# Patient Record
Sex: Male | Born: 1950 | Race: White | Hispanic: No | Marital: Married
Health system: Southern US, Community
[De-identification: ages and names within clinical notes are randomized; demographics above are authoritative.]

---

## 2008-07-02 ENCOUNTER — Ambulatory Visit: Payer: Self-pay | Admitting: Cardiothoracic Surgery

## 2008-08-20 ENCOUNTER — Ambulatory Visit: Payer: Self-pay | Admitting: Thoracic Surgery (Cardiothoracic Vascular Surgery)

## 2011-12-29 DIAGNOSIS — I10 Essential (primary) hypertension: Secondary | ICD-10-CM | POA: Diagnosis not present

## 2011-12-29 DIAGNOSIS — E7800 Pure hypercholesterolemia, unspecified: Secondary | ICD-10-CM | POA: Diagnosis not present

## 2011-12-29 DIAGNOSIS — E119 Type 2 diabetes mellitus without complications: Secondary | ICD-10-CM | POA: Diagnosis not present

## 2011-12-29 DIAGNOSIS — F29 Unspecified psychosis not due to a substance or known physiological condition: Secondary | ICD-10-CM | POA: Diagnosis not present

## 2012-03-09 DIAGNOSIS — L821 Other seborrheic keratosis: Secondary | ICD-10-CM | POA: Diagnosis not present

## 2012-03-09 DIAGNOSIS — L820 Inflamed seborrheic keratosis: Secondary | ICD-10-CM | POA: Diagnosis not present

## 2012-03-26 DIAGNOSIS — E7800 Pure hypercholesterolemia, unspecified: Secondary | ICD-10-CM | POA: Diagnosis not present

## 2012-03-26 DIAGNOSIS — I10 Essential (primary) hypertension: Secondary | ICD-10-CM | POA: Diagnosis not present

## 2012-03-26 DIAGNOSIS — I2510 Atherosclerotic heart disease of native coronary artery without angina pectoris: Secondary | ICD-10-CM | POA: Diagnosis not present

## 2012-03-26 DIAGNOSIS — E119 Type 2 diabetes mellitus without complications: Secondary | ICD-10-CM | POA: Diagnosis not present

## 2012-03-26 DIAGNOSIS — Z79899 Other long term (current) drug therapy: Secondary | ICD-10-CM | POA: Diagnosis not present

## 2012-03-26 DIAGNOSIS — F528 Other sexual dysfunction not due to a substance or known physiological condition: Secondary | ICD-10-CM | POA: Diagnosis not present

## 2012-04-10 DIAGNOSIS — R0609 Other forms of dyspnea: Secondary | ICD-10-CM | POA: Diagnosis not present

## 2012-04-10 DIAGNOSIS — R7309 Other abnormal glucose: Secondary | ICD-10-CM | POA: Diagnosis not present

## 2012-04-10 DIAGNOSIS — R0989 Other specified symptoms and signs involving the circulatory and respiratory systems: Secondary | ICD-10-CM | POA: Diagnosis not present

## 2012-04-10 DIAGNOSIS — E785 Hyperlipidemia, unspecified: Secondary | ICD-10-CM | POA: Diagnosis not present

## 2012-04-10 DIAGNOSIS — I2510 Atherosclerotic heart disease of native coronary artery without angina pectoris: Secondary | ICD-10-CM | POA: Diagnosis not present

## 2012-06-25 DIAGNOSIS — E119 Type 2 diabetes mellitus without complications: Secondary | ICD-10-CM | POA: Diagnosis not present

## 2012-06-25 DIAGNOSIS — I10 Essential (primary) hypertension: Secondary | ICD-10-CM | POA: Diagnosis not present

## 2012-06-25 DIAGNOSIS — E7800 Pure hypercholesterolemia, unspecified: Secondary | ICD-10-CM | POA: Diagnosis not present

## 2012-07-31 DIAGNOSIS — M5137 Other intervertebral disc degeneration, lumbosacral region: Secondary | ICD-10-CM | POA: Diagnosis not present

## 2012-07-31 DIAGNOSIS — M999 Biomechanical lesion, unspecified: Secondary | ICD-10-CM | POA: Diagnosis not present

## 2012-08-02 DIAGNOSIS — M5137 Other intervertebral disc degeneration, lumbosacral region: Secondary | ICD-10-CM | POA: Diagnosis not present

## 2012-08-02 DIAGNOSIS — M999 Biomechanical lesion, unspecified: Secondary | ICD-10-CM | POA: Diagnosis not present

## 2012-08-09 DIAGNOSIS — M999 Biomechanical lesion, unspecified: Secondary | ICD-10-CM | POA: Diagnosis not present

## 2012-08-09 DIAGNOSIS — M5137 Other intervertebral disc degeneration, lumbosacral region: Secondary | ICD-10-CM | POA: Diagnosis not present

## 2012-10-25 DIAGNOSIS — E7800 Pure hypercholesterolemia, unspecified: Secondary | ICD-10-CM | POA: Diagnosis not present

## 2012-10-25 DIAGNOSIS — E119 Type 2 diabetes mellitus without complications: Secondary | ICD-10-CM | POA: Diagnosis not present

## 2012-10-25 DIAGNOSIS — I10 Essential (primary) hypertension: Secondary | ICD-10-CM | POA: Diagnosis not present

## 2012-10-25 DIAGNOSIS — K429 Umbilical hernia without obstruction or gangrene: Secondary | ICD-10-CM | POA: Diagnosis not present

## 2012-10-25 DIAGNOSIS — Z23 Encounter for immunization: Secondary | ICD-10-CM | POA: Diagnosis not present

## 2012-11-05 DIAGNOSIS — E119 Type 2 diabetes mellitus without complications: Secondary | ICD-10-CM | POA: Diagnosis not present

## 2012-11-05 DIAGNOSIS — I2510 Atherosclerotic heart disease of native coronary artery without angina pectoris: Secondary | ICD-10-CM | POA: Diagnosis not present

## 2012-11-05 DIAGNOSIS — E785 Hyperlipidemia, unspecified: Secondary | ICD-10-CM | POA: Diagnosis not present

## 2013-01-24 DIAGNOSIS — M25559 Pain in unspecified hip: Secondary | ICD-10-CM | POA: Diagnosis not present

## 2013-02-15 DIAGNOSIS — M25559 Pain in unspecified hip: Secondary | ICD-10-CM | POA: Diagnosis not present

## 2013-02-15 DIAGNOSIS — M169 Osteoarthritis of hip, unspecified: Secondary | ICD-10-CM | POA: Diagnosis not present

## 2013-02-22 DIAGNOSIS — M25559 Pain in unspecified hip: Secondary | ICD-10-CM | POA: Diagnosis not present

## 2013-02-28 DIAGNOSIS — I10 Essential (primary) hypertension: Secondary | ICD-10-CM | POA: Diagnosis not present

## 2013-02-28 DIAGNOSIS — Z7982 Long term (current) use of aspirin: Secondary | ICD-10-CM | POA: Diagnosis not present

## 2013-02-28 DIAGNOSIS — Z79899 Other long term (current) drug therapy: Secondary | ICD-10-CM | POA: Diagnosis not present

## 2013-02-28 DIAGNOSIS — Z951 Presence of aortocoronary bypass graft: Secondary | ICD-10-CM | POA: Diagnosis not present

## 2013-02-28 DIAGNOSIS — I252 Old myocardial infarction: Secondary | ICD-10-CM | POA: Diagnosis not present

## 2013-02-28 DIAGNOSIS — R6889 Other general symptoms and signs: Secondary | ICD-10-CM | POA: Diagnosis not present

## 2013-02-28 DIAGNOSIS — Z9861 Coronary angioplasty status: Secondary | ICD-10-CM | POA: Diagnosis not present

## 2013-02-28 DIAGNOSIS — R05 Cough: Secondary | ICD-10-CM | POA: Diagnosis not present

## 2013-02-28 DIAGNOSIS — R0989 Other specified symptoms and signs involving the circulatory and respiratory systems: Secondary | ICD-10-CM | POA: Diagnosis not present

## 2013-02-28 DIAGNOSIS — J069 Acute upper respiratory infection, unspecified: Secondary | ICD-10-CM | POA: Diagnosis not present

## 2013-02-28 DIAGNOSIS — J3489 Other specified disorders of nose and nasal sinuses: Secondary | ICD-10-CM | POA: Diagnosis not present

## 2013-02-28 DIAGNOSIS — E785 Hyperlipidemia, unspecified: Secondary | ICD-10-CM | POA: Diagnosis not present

## 2013-03-12 DIAGNOSIS — E785 Hyperlipidemia, unspecified: Secondary | ICD-10-CM | POA: Diagnosis not present

## 2013-03-12 DIAGNOSIS — Z79899 Other long term (current) drug therapy: Secondary | ICD-10-CM | POA: Diagnosis not present

## 2013-03-12 DIAGNOSIS — Z951 Presence of aortocoronary bypass graft: Secondary | ICD-10-CM | POA: Diagnosis not present

## 2013-03-12 DIAGNOSIS — Z7982 Long term (current) use of aspirin: Secondary | ICD-10-CM | POA: Diagnosis not present

## 2013-03-12 DIAGNOSIS — I252 Old myocardial infarction: Secondary | ICD-10-CM | POA: Diagnosis not present

## 2013-03-12 DIAGNOSIS — I10 Essential (primary) hypertension: Secondary | ICD-10-CM | POA: Diagnosis not present

## 2013-03-12 DIAGNOSIS — Z9861 Coronary angioplasty status: Secondary | ICD-10-CM | POA: Diagnosis not present

## 2013-03-12 DIAGNOSIS — R05 Cough: Secondary | ICD-10-CM | POA: Diagnosis not present

## 2013-03-12 DIAGNOSIS — IMO0002 Reserved for concepts with insufficient information to code with codable children: Secondary | ICD-10-CM | POA: Diagnosis not present

## 2013-03-12 DIAGNOSIS — J209 Acute bronchitis, unspecified: Secondary | ICD-10-CM | POA: Diagnosis not present

## 2013-03-12 DIAGNOSIS — R079 Chest pain, unspecified: Secondary | ICD-10-CM | POA: Diagnosis not present

## 2013-04-23 DIAGNOSIS — Z79899 Other long term (current) drug therapy: Secondary | ICD-10-CM | POA: Diagnosis not present

## 2013-04-23 DIAGNOSIS — E7800 Pure hypercholesterolemia, unspecified: Secondary | ICD-10-CM | POA: Diagnosis not present

## 2013-04-23 DIAGNOSIS — E119 Type 2 diabetes mellitus without complications: Secondary | ICD-10-CM | POA: Diagnosis not present

## 2013-04-23 DIAGNOSIS — I10 Essential (primary) hypertension: Secondary | ICD-10-CM | POA: Diagnosis not present

## 2013-05-20 DIAGNOSIS — I2510 Atherosclerotic heart disease of native coronary artery without angina pectoris: Secondary | ICD-10-CM | POA: Diagnosis not present

## 2013-05-20 DIAGNOSIS — E785 Hyperlipidemia, unspecified: Secondary | ICD-10-CM | POA: Diagnosis not present

## 2013-05-20 DIAGNOSIS — E119 Type 2 diabetes mellitus without complications: Secondary | ICD-10-CM | POA: Diagnosis not present

## 2013-05-20 DIAGNOSIS — R0609 Other forms of dyspnea: Secondary | ICD-10-CM | POA: Diagnosis not present

## 2013-05-20 DIAGNOSIS — R079 Chest pain, unspecified: Secondary | ICD-10-CM | POA: Diagnosis not present

## 2013-05-20 DIAGNOSIS — R0989 Other specified symptoms and signs involving the circulatory and respiratory systems: Secondary | ICD-10-CM | POA: Diagnosis not present

## 2013-07-25 DIAGNOSIS — E119 Type 2 diabetes mellitus without complications: Secondary | ICD-10-CM | POA: Diagnosis not present

## 2013-07-25 DIAGNOSIS — I10 Essential (primary) hypertension: Secondary | ICD-10-CM | POA: Diagnosis not present

## 2013-07-25 DIAGNOSIS — M79609 Pain in unspecified limb: Secondary | ICD-10-CM | POA: Diagnosis not present

## 2013-07-25 DIAGNOSIS — F528 Other sexual dysfunction not due to a substance or known physiological condition: Secondary | ICD-10-CM | POA: Diagnosis not present

## 2013-07-25 DIAGNOSIS — E7800 Pure hypercholesterolemia, unspecified: Secondary | ICD-10-CM | POA: Diagnosis not present

## 2013-07-25 DIAGNOSIS — L723 Sebaceous cyst: Secondary | ICD-10-CM | POA: Diagnosis not present

## 2013-08-15 DIAGNOSIS — D485 Neoplasm of uncertain behavior of skin: Secondary | ICD-10-CM | POA: Diagnosis not present

## 2013-10-14 DIAGNOSIS — J069 Acute upper respiratory infection, unspecified: Secondary | ICD-10-CM | POA: Diagnosis not present

## 2013-11-12 DIAGNOSIS — I10 Essential (primary) hypertension: Secondary | ICD-10-CM | POA: Diagnosis not present

## 2013-11-12 DIAGNOSIS — E7800 Pure hypercholesterolemia, unspecified: Secondary | ICD-10-CM | POA: Diagnosis not present

## 2013-11-12 DIAGNOSIS — Z1211 Encounter for screening for malignant neoplasm of colon: Secondary | ICD-10-CM | POA: Diagnosis not present

## 2013-11-12 DIAGNOSIS — E119 Type 2 diabetes mellitus without complications: Secondary | ICD-10-CM | POA: Diagnosis not present

## 2013-11-12 DIAGNOSIS — Z23 Encounter for immunization: Secondary | ICD-10-CM | POA: Diagnosis not present

## 2014-03-13 DIAGNOSIS — I2510 Atherosclerotic heart disease of native coronary artery without angina pectoris: Secondary | ICD-10-CM | POA: Diagnosis not present

## 2014-03-13 DIAGNOSIS — R0989 Other specified symptoms and signs involving the circulatory and respiratory systems: Secondary | ICD-10-CM | POA: Diagnosis not present

## 2014-03-13 DIAGNOSIS — R0609 Other forms of dyspnea: Secondary | ICD-10-CM | POA: Diagnosis not present

## 2014-03-13 DIAGNOSIS — E785 Hyperlipidemia, unspecified: Secondary | ICD-10-CM | POA: Diagnosis not present

## 2014-03-13 DIAGNOSIS — R079 Chest pain, unspecified: Secondary | ICD-10-CM | POA: Diagnosis not present

## 2014-03-13 DIAGNOSIS — E119 Type 2 diabetes mellitus without complications: Secondary | ICD-10-CM | POA: Diagnosis not present

## 2014-04-11 DIAGNOSIS — M25559 Pain in unspecified hip: Secondary | ICD-10-CM | POA: Diagnosis not present

## 2014-04-11 DIAGNOSIS — M169 Osteoarthritis of hip, unspecified: Secondary | ICD-10-CM | POA: Diagnosis not present

## 2014-04-11 DIAGNOSIS — M1610 Unilateral primary osteoarthritis, unspecified hip: Secondary | ICD-10-CM | POA: Diagnosis not present

## 2014-04-21 DIAGNOSIS — Z79899 Other long term (current) drug therapy: Secondary | ICD-10-CM | POA: Diagnosis not present

## 2014-04-21 DIAGNOSIS — M79609 Pain in unspecified limb: Secondary | ICD-10-CM | POA: Diagnosis not present

## 2014-04-24 DIAGNOSIS — M79609 Pain in unspecified limb: Secondary | ICD-10-CM | POA: Diagnosis not present

## 2014-04-25 DIAGNOSIS — M1610 Unilateral primary osteoarthritis, unspecified hip: Secondary | ICD-10-CM | POA: Diagnosis not present

## 2014-04-25 DIAGNOSIS — M25559 Pain in unspecified hip: Secondary | ICD-10-CM | POA: Diagnosis not present

## 2014-04-25 DIAGNOSIS — M169 Osteoarthritis of hip, unspecified: Secondary | ICD-10-CM | POA: Diagnosis not present

## 2014-04-25 DIAGNOSIS — R29898 Other symptoms and signs involving the musculoskeletal system: Secondary | ICD-10-CM | POA: Diagnosis not present

## 2014-05-01 DIAGNOSIS — E7800 Pure hypercholesterolemia, unspecified: Secondary | ICD-10-CM | POA: Diagnosis not present

## 2014-05-01 DIAGNOSIS — I10 Essential (primary) hypertension: Secondary | ICD-10-CM | POA: Diagnosis not present

## 2014-05-01 DIAGNOSIS — E119 Type 2 diabetes mellitus without complications: Secondary | ICD-10-CM | POA: Diagnosis not present

## 2014-05-01 DIAGNOSIS — Z79899 Other long term (current) drug therapy: Secondary | ICD-10-CM | POA: Diagnosis not present

## 2014-06-06 DIAGNOSIS — R1032 Left lower quadrant pain: Secondary | ICD-10-CM | POA: Diagnosis not present

## 2014-07-03 DIAGNOSIS — M5137 Other intervertebral disc degeneration, lumbosacral region: Secondary | ICD-10-CM | POA: Diagnosis not present

## 2014-07-03 DIAGNOSIS — R1032 Left lower quadrant pain: Secondary | ICD-10-CM | POA: Diagnosis not present

## 2014-07-04 DIAGNOSIS — E119 Type 2 diabetes mellitus without complications: Secondary | ICD-10-CM | POA: Diagnosis not present

## 2014-07-04 DIAGNOSIS — R1084 Generalized abdominal pain: Secondary | ICD-10-CM | POA: Diagnosis not present

## 2014-07-04 DIAGNOSIS — K8020 Calculus of gallbladder without cholecystitis without obstruction: Secondary | ICD-10-CM | POA: Diagnosis not present

## 2014-07-04 DIAGNOSIS — R160 Hepatomegaly, not elsewhere classified: Secondary | ICD-10-CM | POA: Diagnosis not present

## 2014-07-04 DIAGNOSIS — K7689 Other specified diseases of liver: Secondary | ICD-10-CM | POA: Diagnosis not present

## 2014-07-08 DIAGNOSIS — K8010 Calculus of gallbladder with chronic cholecystitis without obstruction: Secondary | ICD-10-CM | POA: Diagnosis not present

## 2014-07-11 DIAGNOSIS — K8010 Calculus of gallbladder with chronic cholecystitis without obstruction: Secondary | ICD-10-CM | POA: Diagnosis not present

## 2014-07-11 DIAGNOSIS — I4510 Unspecified right bundle-branch block: Secondary | ICD-10-CM | POA: Diagnosis not present

## 2014-07-14 DIAGNOSIS — K8010 Calculus of gallbladder with chronic cholecystitis without obstruction: Secondary | ICD-10-CM | POA: Diagnosis not present

## 2014-07-14 DIAGNOSIS — M129 Arthropathy, unspecified: Secondary | ICD-10-CM | POA: Diagnosis not present

## 2014-07-14 DIAGNOSIS — K828 Other specified diseases of gallbladder: Secondary | ICD-10-CM | POA: Diagnosis not present

## 2014-07-14 DIAGNOSIS — D135 Benign neoplasm of extrahepatic bile ducts: Secondary | ICD-10-CM | POA: Diagnosis not present

## 2014-07-14 DIAGNOSIS — K811 Chronic cholecystitis: Secondary | ICD-10-CM | POA: Diagnosis not present

## 2014-07-14 DIAGNOSIS — Z79899 Other long term (current) drug therapy: Secondary | ICD-10-CM | POA: Diagnosis not present

## 2014-07-14 DIAGNOSIS — I2510 Atherosclerotic heart disease of native coronary artery without angina pectoris: Secondary | ICD-10-CM | POA: Diagnosis not present

## 2014-07-14 DIAGNOSIS — D134 Benign neoplasm of liver: Secondary | ICD-10-CM | POA: Diagnosis not present

## 2014-07-14 DIAGNOSIS — Z951 Presence of aortocoronary bypass graft: Secondary | ICD-10-CM | POA: Diagnosis not present

## 2014-07-14 DIAGNOSIS — Z7982 Long term (current) use of aspirin: Secondary | ICD-10-CM | POA: Diagnosis not present

## 2014-07-14 DIAGNOSIS — I252 Old myocardial infarction: Secondary | ICD-10-CM | POA: Diagnosis not present

## 2014-07-14 DIAGNOSIS — E119 Type 2 diabetes mellitus without complications: Secondary | ICD-10-CM | POA: Diagnosis not present

## 2014-07-14 DIAGNOSIS — E7800 Pure hypercholesterolemia, unspecified: Secondary | ICD-10-CM | POA: Diagnosis not present

## 2014-07-14 DIAGNOSIS — R1011 Right upper quadrant pain: Secondary | ICD-10-CM | POA: Diagnosis not present

## 2014-08-12 DIAGNOSIS — E7800 Pure hypercholesterolemia, unspecified: Secondary | ICD-10-CM | POA: Diagnosis not present

## 2014-08-12 DIAGNOSIS — E119 Type 2 diabetes mellitus without complications: Secondary | ICD-10-CM | POA: Diagnosis not present

## 2014-08-12 DIAGNOSIS — I10 Essential (primary) hypertension: Secondary | ICD-10-CM | POA: Diagnosis not present

## 2014-08-12 DIAGNOSIS — M7989 Other specified soft tissue disorders: Secondary | ICD-10-CM | POA: Diagnosis not present

## 2014-09-22 DIAGNOSIS — R197 Diarrhea, unspecified: Secondary | ICD-10-CM | POA: Diagnosis not present

## 2014-10-10 DIAGNOSIS — R197 Diarrhea, unspecified: Secondary | ICD-10-CM | POA: Diagnosis not present

## 2014-10-10 DIAGNOSIS — K5790 Diverticulosis of intestine, part unspecified, without perforation or abscess without bleeding: Secondary | ICD-10-CM | POA: Diagnosis not present

## 2014-10-10 DIAGNOSIS — R5383 Other fatigue: Secondary | ICD-10-CM | POA: Diagnosis not present

## 2014-10-10 DIAGNOSIS — K5730 Diverticulosis of large intestine without perforation or abscess without bleeding: Secondary | ICD-10-CM | POA: Diagnosis not present

## 2014-10-10 DIAGNOSIS — K760 Fatty (change of) liver, not elsewhere classified: Secondary | ICD-10-CM | POA: Diagnosis not present

## 2014-10-10 DIAGNOSIS — R1032 Left lower quadrant pain: Secondary | ICD-10-CM | POA: Diagnosis not present

## 2014-10-10 DIAGNOSIS — R1030 Lower abdominal pain, unspecified: Secondary | ICD-10-CM | POA: Diagnosis not present

## 2014-10-10 DIAGNOSIS — R05 Cough: Secondary | ICD-10-CM | POA: Diagnosis not present

## 2014-10-10 DIAGNOSIS — R1013 Epigastric pain: Secondary | ICD-10-CM | POA: Diagnosis not present

## 2014-10-10 DIAGNOSIS — Z87891 Personal history of nicotine dependence: Secondary | ICD-10-CM | POA: Diagnosis not present

## 2014-10-10 DIAGNOSIS — R51 Headache: Secondary | ICD-10-CM | POA: Diagnosis not present

## 2014-10-10 DIAGNOSIS — R112 Nausea with vomiting, unspecified: Secondary | ICD-10-CM | POA: Diagnosis not present

## 2014-10-10 DIAGNOSIS — N200 Calculus of kidney: Secondary | ICD-10-CM | POA: Diagnosis not present

## 2014-10-10 DIAGNOSIS — K429 Umbilical hernia without obstruction or gangrene: Secondary | ICD-10-CM | POA: Diagnosis not present

## 2014-10-10 DIAGNOSIS — I252 Old myocardial infarction: Secondary | ICD-10-CM | POA: Diagnosis not present

## 2014-11-06 DIAGNOSIS — M1611 Unilateral primary osteoarthritis, right hip: Secondary | ICD-10-CM | POA: Diagnosis not present

## 2014-11-06 DIAGNOSIS — M25551 Pain in right hip: Secondary | ICD-10-CM | POA: Diagnosis not present

## 2014-11-19 DIAGNOSIS — J069 Acute upper respiratory infection, unspecified: Secondary | ICD-10-CM | POA: Diagnosis not present

## 2014-11-19 DIAGNOSIS — E119 Type 2 diabetes mellitus without complications: Secondary | ICD-10-CM | POA: Diagnosis not present

## 2014-11-19 DIAGNOSIS — E780 Pure hypercholesterolemia: Secondary | ICD-10-CM | POA: Diagnosis not present

## 2014-11-19 DIAGNOSIS — Z23 Encounter for immunization: Secondary | ICD-10-CM | POA: Diagnosis not present

## 2014-11-19 DIAGNOSIS — I10 Essential (primary) hypertension: Secondary | ICD-10-CM | POA: Diagnosis not present

## 2014-12-04 DIAGNOSIS — Z0000 Encounter for general adult medical examination without abnormal findings: Secondary | ICD-10-CM | POA: Diagnosis not present

## 2014-12-04 DIAGNOSIS — E780 Pure hypercholesterolemia: Secondary | ICD-10-CM | POA: Diagnosis not present

## 2014-12-04 DIAGNOSIS — E119 Type 2 diabetes mellitus without complications: Secondary | ICD-10-CM | POA: Diagnosis not present

## 2014-12-04 DIAGNOSIS — Z01810 Encounter for preprocedural cardiovascular examination: Secondary | ICD-10-CM | POA: Diagnosis not present

## 2014-12-04 DIAGNOSIS — Z125 Encounter for screening for malignant neoplasm of prostate: Secondary | ICD-10-CM | POA: Diagnosis not present

## 2014-12-04 DIAGNOSIS — M1611 Unilateral primary osteoarthritis, right hip: Secondary | ICD-10-CM | POA: Diagnosis not present

## 2014-12-10 DIAGNOSIS — Z7982 Long term (current) use of aspirin: Secondary | ICD-10-CM | POA: Diagnosis not present

## 2014-12-10 DIAGNOSIS — Z951 Presence of aortocoronary bypass graft: Secondary | ICD-10-CM | POA: Diagnosis not present

## 2014-12-10 DIAGNOSIS — I10 Essential (primary) hypertension: Secondary | ICD-10-CM | POA: Diagnosis not present

## 2014-12-10 DIAGNOSIS — Z87891 Personal history of nicotine dependence: Secondary | ICD-10-CM | POA: Diagnosis not present

## 2014-12-10 DIAGNOSIS — E785 Hyperlipidemia, unspecified: Secondary | ICD-10-CM | POA: Diagnosis not present

## 2014-12-17 DIAGNOSIS — I2510 Atherosclerotic heart disease of native coronary artery without angina pectoris: Secondary | ICD-10-CM | POA: Diagnosis not present

## 2014-12-23 DIAGNOSIS — I2510 Atherosclerotic heart disease of native coronary artery without angina pectoris: Secondary | ICD-10-CM | POA: Diagnosis not present

## 2014-12-23 DIAGNOSIS — Z01810 Encounter for preprocedural cardiovascular examination: Secondary | ICD-10-CM | POA: Diagnosis not present

## 2014-12-24 DIAGNOSIS — I10 Essential (primary) hypertension: Secondary | ICD-10-CM | POA: Diagnosis not present

## 2014-12-24 DIAGNOSIS — F329 Major depressive disorder, single episode, unspecified: Secondary | ICD-10-CM | POA: Diagnosis not present

## 2014-12-24 DIAGNOSIS — Z01818 Encounter for other preprocedural examination: Secondary | ICD-10-CM | POA: Diagnosis not present

## 2014-12-24 DIAGNOSIS — I2510 Atherosclerotic heart disease of native coronary artery without angina pectoris: Secondary | ICD-10-CM | POA: Diagnosis not present

## 2014-12-24 DIAGNOSIS — Z01810 Encounter for preprocedural cardiovascular examination: Secondary | ICD-10-CM | POA: Diagnosis not present

## 2014-12-24 DIAGNOSIS — Z01812 Encounter for preprocedural laboratory examination: Secondary | ICD-10-CM | POA: Diagnosis not present

## 2014-12-24 DIAGNOSIS — M1611 Unilateral primary osteoarthritis, right hip: Secondary | ICD-10-CM | POA: Diagnosis not present

## 2014-12-24 DIAGNOSIS — E1165 Type 2 diabetes mellitus with hyperglycemia: Secondary | ICD-10-CM | POA: Diagnosis not present

## 2014-12-31 DIAGNOSIS — Z6833 Body mass index (BMI) 33.0-33.9, adult: Secondary | ICD-10-CM | POA: Diagnosis not present

## 2014-12-31 DIAGNOSIS — I2510 Atherosclerotic heart disease of native coronary artery without angina pectoris: Secondary | ICD-10-CM | POA: Diagnosis not present

## 2014-12-31 DIAGNOSIS — Z888 Allergy status to other drugs, medicaments and biological substances status: Secondary | ICD-10-CM | POA: Diagnosis not present

## 2014-12-31 DIAGNOSIS — Z885 Allergy status to narcotic agent status: Secondary | ICD-10-CM | POA: Diagnosis not present

## 2014-12-31 DIAGNOSIS — M169 Osteoarthritis of hip, unspecified: Secondary | ICD-10-CM | POA: Diagnosis not present

## 2014-12-31 DIAGNOSIS — M25551 Pain in right hip: Secondary | ICD-10-CM | POA: Diagnosis not present

## 2014-12-31 DIAGNOSIS — Z79891 Long term (current) use of opiate analgesic: Secondary | ICD-10-CM | POA: Diagnosis not present

## 2014-12-31 DIAGNOSIS — M1611 Unilateral primary osteoarthritis, right hip: Secondary | ICD-10-CM | POA: Diagnosis not present

## 2014-12-31 DIAGNOSIS — F329 Major depressive disorder, single episode, unspecified: Secondary | ICD-10-CM | POA: Diagnosis not present

## 2014-12-31 DIAGNOSIS — E119 Type 2 diabetes mellitus without complications: Secondary | ICD-10-CM | POA: Diagnosis not present

## 2014-12-31 DIAGNOSIS — I252 Old myocardial infarction: Secondary | ICD-10-CM | POA: Diagnosis not present

## 2014-12-31 DIAGNOSIS — Z79899 Other long term (current) drug therapy: Secondary | ICD-10-CM | POA: Diagnosis not present

## 2014-12-31 DIAGNOSIS — E785 Hyperlipidemia, unspecified: Secondary | ICD-10-CM | POA: Diagnosis not present

## 2014-12-31 DIAGNOSIS — M1610 Unilateral primary osteoarthritis, unspecified hip: Secondary | ICD-10-CM | POA: Diagnosis not present

## 2014-12-31 DIAGNOSIS — I10 Essential (primary) hypertension: Secondary | ICD-10-CM | POA: Diagnosis not present

## 2014-12-31 DIAGNOSIS — Z7982 Long term (current) use of aspirin: Secondary | ICD-10-CM | POA: Diagnosis not present

## 2014-12-31 DIAGNOSIS — Z22321 Carrier or suspected carrier of Methicillin susceptible Staphylococcus aureus: Secondary | ICD-10-CM | POA: Diagnosis not present

## 2014-12-31 DIAGNOSIS — Z881 Allergy status to other antibiotic agents status: Secondary | ICD-10-CM | POA: Diagnosis not present

## 2014-12-31 DIAGNOSIS — E6609 Other obesity due to excess calories: Secondary | ICD-10-CM | POA: Diagnosis not present

## 2014-12-31 DIAGNOSIS — Z951 Presence of aortocoronary bypass graft: Secondary | ICD-10-CM | POA: Diagnosis not present

## 2015-01-03 DIAGNOSIS — E119 Type 2 diabetes mellitus without complications: Secondary | ICD-10-CM | POA: Diagnosis not present

## 2015-01-03 DIAGNOSIS — Z8701 Personal history of pneumonia (recurrent): Secondary | ICD-10-CM | POA: Diagnosis not present

## 2015-01-03 DIAGNOSIS — Z96641 Presence of right artificial hip joint: Secondary | ICD-10-CM | POA: Diagnosis not present

## 2015-01-03 DIAGNOSIS — Z7901 Long term (current) use of anticoagulants: Secondary | ICD-10-CM | POA: Diagnosis not present

## 2015-01-03 DIAGNOSIS — Z5181 Encounter for therapeutic drug level monitoring: Secondary | ICD-10-CM | POA: Diagnosis not present

## 2015-01-03 DIAGNOSIS — I252 Old myocardial infarction: Secondary | ICD-10-CM | POA: Diagnosis not present

## 2015-01-03 DIAGNOSIS — I10 Essential (primary) hypertension: Secondary | ICD-10-CM | POA: Diagnosis not present

## 2015-01-03 DIAGNOSIS — Z471 Aftercare following joint replacement surgery: Secondary | ICD-10-CM | POA: Diagnosis not present

## 2015-01-06 DIAGNOSIS — I252 Old myocardial infarction: Secondary | ICD-10-CM | POA: Diagnosis not present

## 2015-01-06 DIAGNOSIS — I10 Essential (primary) hypertension: Secondary | ICD-10-CM | POA: Diagnosis not present

## 2015-01-06 DIAGNOSIS — E119 Type 2 diabetes mellitus without complications: Secondary | ICD-10-CM | POA: Diagnosis not present

## 2015-01-06 DIAGNOSIS — Z471 Aftercare following joint replacement surgery: Secondary | ICD-10-CM | POA: Diagnosis not present

## 2015-01-06 DIAGNOSIS — Z96641 Presence of right artificial hip joint: Secondary | ICD-10-CM | POA: Diagnosis not present

## 2015-01-06 DIAGNOSIS — Z5181 Encounter for therapeutic drug level monitoring: Secondary | ICD-10-CM | POA: Diagnosis not present

## 2015-01-08 DIAGNOSIS — Z471 Aftercare following joint replacement surgery: Secondary | ICD-10-CM | POA: Diagnosis not present

## 2015-01-08 DIAGNOSIS — Z96641 Presence of right artificial hip joint: Secondary | ICD-10-CM | POA: Diagnosis not present

## 2015-01-08 DIAGNOSIS — I10 Essential (primary) hypertension: Secondary | ICD-10-CM | POA: Diagnosis not present

## 2015-01-08 DIAGNOSIS — Z5181 Encounter for therapeutic drug level monitoring: Secondary | ICD-10-CM | POA: Diagnosis not present

## 2015-01-08 DIAGNOSIS — E119 Type 2 diabetes mellitus without complications: Secondary | ICD-10-CM | POA: Diagnosis not present

## 2015-01-08 DIAGNOSIS — I252 Old myocardial infarction: Secondary | ICD-10-CM | POA: Diagnosis not present

## 2015-01-09 DIAGNOSIS — R21 Rash and other nonspecific skin eruption: Secondary | ICD-10-CM | POA: Diagnosis not present

## 2015-01-13 DIAGNOSIS — Z96641 Presence of right artificial hip joint: Secondary | ICD-10-CM | POA: Diagnosis not present

## 2015-01-14 DIAGNOSIS — I10 Essential (primary) hypertension: Secondary | ICD-10-CM | POA: Diagnosis not present

## 2015-01-14 DIAGNOSIS — E119 Type 2 diabetes mellitus without complications: Secondary | ICD-10-CM | POA: Diagnosis not present

## 2015-01-14 DIAGNOSIS — I252 Old myocardial infarction: Secondary | ICD-10-CM | POA: Diagnosis not present

## 2015-01-14 DIAGNOSIS — Z96641 Presence of right artificial hip joint: Secondary | ICD-10-CM | POA: Diagnosis not present

## 2015-01-14 DIAGNOSIS — Z471 Aftercare following joint replacement surgery: Secondary | ICD-10-CM | POA: Diagnosis not present

## 2015-01-14 DIAGNOSIS — Z5181 Encounter for therapeutic drug level monitoring: Secondary | ICD-10-CM | POA: Diagnosis not present

## 2015-01-15 DIAGNOSIS — I252 Old myocardial infarction: Secondary | ICD-10-CM | POA: Diagnosis not present

## 2015-01-15 DIAGNOSIS — Z471 Aftercare following joint replacement surgery: Secondary | ICD-10-CM | POA: Diagnosis not present

## 2015-01-15 DIAGNOSIS — Z96641 Presence of right artificial hip joint: Secondary | ICD-10-CM | POA: Diagnosis not present

## 2015-01-15 DIAGNOSIS — E119 Type 2 diabetes mellitus without complications: Secondary | ICD-10-CM | POA: Diagnosis not present

## 2015-01-15 DIAGNOSIS — I10 Essential (primary) hypertension: Secondary | ICD-10-CM | POA: Diagnosis not present

## 2015-01-15 DIAGNOSIS — Z5181 Encounter for therapeutic drug level monitoring: Secondary | ICD-10-CM | POA: Diagnosis not present

## 2015-01-16 DIAGNOSIS — Z96641 Presence of right artificial hip joint: Secondary | ICD-10-CM | POA: Diagnosis not present

## 2015-01-16 DIAGNOSIS — E119 Type 2 diabetes mellitus without complications: Secondary | ICD-10-CM | POA: Diagnosis not present

## 2015-01-16 DIAGNOSIS — Z5181 Encounter for therapeutic drug level monitoring: Secondary | ICD-10-CM | POA: Diagnosis not present

## 2015-01-16 DIAGNOSIS — Z471 Aftercare following joint replacement surgery: Secondary | ICD-10-CM | POA: Diagnosis not present

## 2015-01-16 DIAGNOSIS — I252 Old myocardial infarction: Secondary | ICD-10-CM | POA: Diagnosis not present

## 2015-01-16 DIAGNOSIS — I10 Essential (primary) hypertension: Secondary | ICD-10-CM | POA: Diagnosis not present

## 2015-01-20 DIAGNOSIS — Z96641 Presence of right artificial hip joint: Secondary | ICD-10-CM | POA: Diagnosis not present

## 2015-01-20 DIAGNOSIS — Z5181 Encounter for therapeutic drug level monitoring: Secondary | ICD-10-CM | POA: Diagnosis not present

## 2015-01-20 DIAGNOSIS — I10 Essential (primary) hypertension: Secondary | ICD-10-CM | POA: Diagnosis not present

## 2015-01-20 DIAGNOSIS — I252 Old myocardial infarction: Secondary | ICD-10-CM | POA: Diagnosis not present

## 2015-01-20 DIAGNOSIS — Z471 Aftercare following joint replacement surgery: Secondary | ICD-10-CM | POA: Diagnosis not present

## 2015-01-20 DIAGNOSIS — E119 Type 2 diabetes mellitus without complications: Secondary | ICD-10-CM | POA: Diagnosis not present

## 2015-01-22 DIAGNOSIS — I10 Essential (primary) hypertension: Secondary | ICD-10-CM | POA: Diagnosis not present

## 2015-01-22 DIAGNOSIS — E119 Type 2 diabetes mellitus without complications: Secondary | ICD-10-CM | POA: Diagnosis not present

## 2015-01-22 DIAGNOSIS — I252 Old myocardial infarction: Secondary | ICD-10-CM | POA: Diagnosis not present

## 2015-01-22 DIAGNOSIS — Z471 Aftercare following joint replacement surgery: Secondary | ICD-10-CM | POA: Diagnosis not present

## 2015-01-22 DIAGNOSIS — Z96641 Presence of right artificial hip joint: Secondary | ICD-10-CM | POA: Diagnosis not present

## 2015-01-22 DIAGNOSIS — Z5181 Encounter for therapeutic drug level monitoring: Secondary | ICD-10-CM | POA: Diagnosis not present

## 2015-01-23 DIAGNOSIS — Z96641 Presence of right artificial hip joint: Secondary | ICD-10-CM | POA: Diagnosis not present

## 2015-01-23 DIAGNOSIS — Z471 Aftercare following joint replacement surgery: Secondary | ICD-10-CM | POA: Diagnosis not present

## 2015-01-23 DIAGNOSIS — E119 Type 2 diabetes mellitus without complications: Secondary | ICD-10-CM | POA: Diagnosis not present

## 2015-01-23 DIAGNOSIS — Z5181 Encounter for therapeutic drug level monitoring: Secondary | ICD-10-CM | POA: Diagnosis not present

## 2015-01-23 DIAGNOSIS — I252 Old myocardial infarction: Secondary | ICD-10-CM | POA: Diagnosis not present

## 2015-01-23 DIAGNOSIS — I10 Essential (primary) hypertension: Secondary | ICD-10-CM | POA: Diagnosis not present

## 2015-05-19 DIAGNOSIS — M25551 Pain in right hip: Secondary | ICD-10-CM | POA: Diagnosis not present

## 2015-06-24 DIAGNOSIS — E119 Type 2 diabetes mellitus without complications: Secondary | ICD-10-CM | POA: Diagnosis not present

## 2015-06-24 DIAGNOSIS — Z79899 Other long term (current) drug therapy: Secondary | ICD-10-CM | POA: Diagnosis not present

## 2015-06-24 DIAGNOSIS — I10 Essential (primary) hypertension: Secondary | ICD-10-CM | POA: Diagnosis not present

## 2015-06-24 DIAGNOSIS — E780 Pure hypercholesterolemia: Secondary | ICD-10-CM | POA: Diagnosis not present

## 2015-06-30 DIAGNOSIS — I10 Essential (primary) hypertension: Secondary | ICD-10-CM | POA: Diagnosis not present

## 2015-06-30 DIAGNOSIS — E119 Type 2 diabetes mellitus without complications: Secondary | ICD-10-CM | POA: Diagnosis not present

## 2015-06-30 DIAGNOSIS — E780 Pure hypercholesterolemia: Secondary | ICD-10-CM | POA: Diagnosis not present

## 2015-07-23 DIAGNOSIS — R42 Dizziness and giddiness: Secondary | ICD-10-CM | POA: Diagnosis not present

## 2015-07-23 DIAGNOSIS — R51 Headache: Secondary | ICD-10-CM | POA: Diagnosis not present

## 2015-08-20 DIAGNOSIS — H9202 Otalgia, left ear: Secondary | ICD-10-CM | POA: Diagnosis not present

## 2015-08-20 DIAGNOSIS — J302 Other seasonal allergic rhinitis: Secondary | ICD-10-CM | POA: Diagnosis not present

## 2015-08-20 DIAGNOSIS — H9201 Otalgia, right ear: Secondary | ICD-10-CM | POA: Diagnosis not present

## 2015-09-12 DIAGNOSIS — R109 Unspecified abdominal pain: Secondary | ICD-10-CM | POA: Diagnosis not present

## 2015-09-12 DIAGNOSIS — I252 Old myocardial infarction: Secondary | ICD-10-CM | POA: Diagnosis not present

## 2015-09-12 DIAGNOSIS — I2510 Atherosclerotic heart disease of native coronary artery without angina pectoris: Secondary | ICD-10-CM | POA: Diagnosis not present

## 2015-09-12 DIAGNOSIS — N200 Calculus of kidney: Secondary | ICD-10-CM | POA: Diagnosis not present

## 2015-09-12 DIAGNOSIS — Z87891 Personal history of nicotine dependence: Secondary | ICD-10-CM | POA: Diagnosis not present

## 2015-09-12 DIAGNOSIS — E780 Pure hypercholesterolemia: Secondary | ICD-10-CM | POA: Diagnosis not present

## 2015-09-12 DIAGNOSIS — R110 Nausea: Secondary | ICD-10-CM | POA: Diagnosis not present

## 2015-12-07 DIAGNOSIS — Z23 Encounter for immunization: Secondary | ICD-10-CM | POA: Diagnosis not present

## 2015-12-07 DIAGNOSIS — Z0000 Encounter for general adult medical examination without abnormal findings: Secondary | ICD-10-CM | POA: Diagnosis not present

## 2015-12-07 DIAGNOSIS — Z125 Encounter for screening for malignant neoplasm of prostate: Secondary | ICD-10-CM | POA: Diagnosis not present

## 2015-12-07 DIAGNOSIS — E669 Obesity, unspecified: Secondary | ICD-10-CM | POA: Diagnosis not present

## 2015-12-07 DIAGNOSIS — Z1211 Encounter for screening for malignant neoplasm of colon: Secondary | ICD-10-CM | POA: Diagnosis not present

## 2015-12-07 DIAGNOSIS — E7800 Pure hypercholesterolemia, unspecified: Secondary | ICD-10-CM | POA: Diagnosis not present

## 2015-12-07 DIAGNOSIS — Z6831 Body mass index (BMI) 31.0-31.9, adult: Secondary | ICD-10-CM | POA: Diagnosis not present

## 2015-12-07 DIAGNOSIS — Z79899 Other long term (current) drug therapy: Secondary | ICD-10-CM | POA: Diagnosis not present

## 2015-12-07 DIAGNOSIS — E119 Type 2 diabetes mellitus without complications: Secondary | ICD-10-CM | POA: Diagnosis not present

## 2015-12-07 DIAGNOSIS — F5221 Male erectile disorder: Secondary | ICD-10-CM | POA: Diagnosis not present

## 2015-12-07 DIAGNOSIS — I2510 Atherosclerotic heart disease of native coronary artery without angina pectoris: Secondary | ICD-10-CM | POA: Diagnosis not present

## 2015-12-07 DIAGNOSIS — I10 Essential (primary) hypertension: Secondary | ICD-10-CM | POA: Diagnosis not present

## 2016-01-18 DIAGNOSIS — I2510 Atherosclerotic heart disease of native coronary artery without angina pectoris: Secondary | ICD-10-CM | POA: Diagnosis not present

## 2016-01-18 DIAGNOSIS — I10 Essential (primary) hypertension: Secondary | ICD-10-CM | POA: Diagnosis not present

## 2016-01-18 DIAGNOSIS — Z7982 Long term (current) use of aspirin: Secondary | ICD-10-CM | POA: Diagnosis not present

## 2016-01-18 DIAGNOSIS — Z720 Tobacco use: Secondary | ICD-10-CM | POA: Diagnosis not present

## 2016-01-18 DIAGNOSIS — Z951 Presence of aortocoronary bypass graft: Secondary | ICD-10-CM | POA: Diagnosis not present

## 2016-01-18 DIAGNOSIS — E7800 Pure hypercholesterolemia, unspecified: Secondary | ICD-10-CM | POA: Diagnosis not present

## 2016-01-21 DIAGNOSIS — I517 Cardiomegaly: Secondary | ICD-10-CM | POA: Diagnosis not present

## 2016-03-01 DIAGNOSIS — Z720 Tobacco use: Secondary | ICD-10-CM | POA: Diagnosis not present

## 2016-03-01 DIAGNOSIS — E7800 Pure hypercholesterolemia, unspecified: Secondary | ICD-10-CM | POA: Diagnosis not present

## 2016-03-01 DIAGNOSIS — I2510 Atherosclerotic heart disease of native coronary artery without angina pectoris: Secondary | ICD-10-CM | POA: Diagnosis not present

## 2016-03-01 DIAGNOSIS — Z951 Presence of aortocoronary bypass graft: Secondary | ICD-10-CM | POA: Diagnosis not present

## 2016-03-01 DIAGNOSIS — I10 Essential (primary) hypertension: Secondary | ICD-10-CM | POA: Diagnosis not present

## 2016-03-01 DIAGNOSIS — Z7982 Long term (current) use of aspirin: Secondary | ICD-10-CM | POA: Diagnosis not present

## 2016-03-10 DIAGNOSIS — I2510 Atherosclerotic heart disease of native coronary artery without angina pectoris: Secondary | ICD-10-CM | POA: Diagnosis not present

## 2016-03-10 DIAGNOSIS — J069 Acute upper respiratory infection, unspecified: Secondary | ICD-10-CM | POA: Diagnosis not present

## 2016-03-10 DIAGNOSIS — E7800 Pure hypercholesterolemia, unspecified: Secondary | ICD-10-CM | POA: Diagnosis not present

## 2016-03-10 DIAGNOSIS — E119 Type 2 diabetes mellitus without complications: Secondary | ICD-10-CM | POA: Diagnosis not present

## 2016-03-10 DIAGNOSIS — I10 Essential (primary) hypertension: Secondary | ICD-10-CM | POA: Diagnosis not present

## 2016-03-11 DIAGNOSIS — Z720 Tobacco use: Secondary | ICD-10-CM | POA: Diagnosis not present

## 2016-03-11 DIAGNOSIS — E7800 Pure hypercholesterolemia, unspecified: Secondary | ICD-10-CM | POA: Diagnosis not present

## 2016-03-11 DIAGNOSIS — I2510 Atherosclerotic heart disease of native coronary artery without angina pectoris: Secondary | ICD-10-CM | POA: Diagnosis not present

## 2016-03-11 DIAGNOSIS — Z7982 Long term (current) use of aspirin: Secondary | ICD-10-CM | POA: Diagnosis not present

## 2016-03-11 DIAGNOSIS — I10 Essential (primary) hypertension: Secondary | ICD-10-CM | POA: Diagnosis not present

## 2016-03-11 DIAGNOSIS — Z951 Presence of aortocoronary bypass graft: Secondary | ICD-10-CM | POA: Diagnosis not present

## 2016-03-31 DIAGNOSIS — M25551 Pain in right hip: Secondary | ICD-10-CM | POA: Diagnosis not present

## 2016-03-31 DIAGNOSIS — Z96641 Presence of right artificial hip joint: Secondary | ICD-10-CM | POA: Diagnosis not present

## 2016-03-31 DIAGNOSIS — M898X9 Other specified disorders of bone, unspecified site: Secondary | ICD-10-CM | POA: Diagnosis not present

## 2016-05-12 DIAGNOSIS — Z96641 Presence of right artificial hip joint: Secondary | ICD-10-CM | POA: Diagnosis not present

## 2016-05-12 DIAGNOSIS — A6920 Lyme disease, unspecified: Secondary | ICD-10-CM | POA: Diagnosis not present

## 2016-05-12 DIAGNOSIS — S70361A Insect bite (nonvenomous), right thigh, initial encounter: Secondary | ICD-10-CM | POA: Diagnosis not present

## 2016-05-12 DIAGNOSIS — M898X9 Other specified disorders of bone, unspecified site: Secondary | ICD-10-CM | POA: Diagnosis not present

## 2016-06-23 DIAGNOSIS — M898X9 Other specified disorders of bone, unspecified site: Secondary | ICD-10-CM | POA: Diagnosis not present

## 2016-06-23 DIAGNOSIS — M25551 Pain in right hip: Secondary | ICD-10-CM | POA: Diagnosis not present

## 2016-06-23 DIAGNOSIS — G8929 Other chronic pain: Secondary | ICD-10-CM | POA: Diagnosis not present

## 2016-07-07 DIAGNOSIS — E7800 Pure hypercholesterolemia, unspecified: Secondary | ICD-10-CM | POA: Diagnosis not present

## 2016-07-07 DIAGNOSIS — I10 Essential (primary) hypertension: Secondary | ICD-10-CM | POA: Diagnosis not present

## 2016-07-07 DIAGNOSIS — I2510 Atherosclerotic heart disease of native coronary artery without angina pectoris: Secondary | ICD-10-CM | POA: Diagnosis not present

## 2016-07-07 DIAGNOSIS — Z79899 Other long term (current) drug therapy: Secondary | ICD-10-CM | POA: Diagnosis not present

## 2016-07-07 DIAGNOSIS — E119 Type 2 diabetes mellitus without complications: Secondary | ICD-10-CM | POA: Diagnosis not present

## 2016-08-18 DIAGNOSIS — E119 Type 2 diabetes mellitus without complications: Secondary | ICD-10-CM | POA: Diagnosis not present

## 2016-08-18 DIAGNOSIS — M6200 Separation of muscle (nontraumatic), unspecified site: Secondary | ICD-10-CM | POA: Diagnosis not present

## 2016-08-18 DIAGNOSIS — K429 Umbilical hernia without obstruction or gangrene: Secondary | ICD-10-CM | POA: Diagnosis not present

## 2016-08-18 DIAGNOSIS — I10 Essential (primary) hypertension: Secondary | ICD-10-CM | POA: Diagnosis not present

## 2016-08-18 DIAGNOSIS — Z79899 Other long term (current) drug therapy: Secondary | ICD-10-CM | POA: Diagnosis not present

## 2016-08-18 DIAGNOSIS — I2510 Atherosclerotic heart disease of native coronary artery without angina pectoris: Secondary | ICD-10-CM | POA: Diagnosis not present

## 2016-08-18 DIAGNOSIS — E7800 Pure hypercholesterolemia, unspecified: Secondary | ICD-10-CM | POA: Diagnosis not present

## 2016-08-30 DIAGNOSIS — K429 Umbilical hernia without obstruction or gangrene: Secondary | ICD-10-CM | POA: Diagnosis not present

## 2016-12-07 DIAGNOSIS — Z0000 Encounter for general adult medical examination without abnormal findings: Secondary | ICD-10-CM | POA: Diagnosis not present

## 2016-12-07 DIAGNOSIS — Z6831 Body mass index (BMI) 31.0-31.9, adult: Secondary | ICD-10-CM | POA: Diagnosis not present

## 2016-12-07 DIAGNOSIS — E119 Type 2 diabetes mellitus without complications: Secondary | ICD-10-CM | POA: Diagnosis not present

## 2016-12-07 DIAGNOSIS — I10 Essential (primary) hypertension: Secondary | ICD-10-CM | POA: Diagnosis not present

## 2016-12-07 DIAGNOSIS — E669 Obesity, unspecified: Secondary | ICD-10-CM | POA: Diagnosis not present

## 2016-12-07 DIAGNOSIS — B3789 Other sites of candidiasis: Secondary | ICD-10-CM | POA: Diagnosis not present

## 2016-12-07 DIAGNOSIS — Z79899 Other long term (current) drug therapy: Secondary | ICD-10-CM | POA: Diagnosis not present

## 2016-12-07 DIAGNOSIS — Z125 Encounter for screening for malignant neoplasm of prostate: Secondary | ICD-10-CM | POA: Diagnosis not present

## 2016-12-07 DIAGNOSIS — Z23 Encounter for immunization: Secondary | ICD-10-CM | POA: Diagnosis not present

## 2017-03-06 DIAGNOSIS — J069 Acute upper respiratory infection, unspecified: Secondary | ICD-10-CM | POA: Diagnosis not present

## 2017-03-06 DIAGNOSIS — B3789 Other sites of candidiasis: Secondary | ICD-10-CM | POA: Diagnosis not present

## 2017-03-06 DIAGNOSIS — E119 Type 2 diabetes mellitus without complications: Secondary | ICD-10-CM | POA: Diagnosis not present

## 2017-04-24 DIAGNOSIS — M542 Cervicalgia: Secondary | ICD-10-CM | POA: Diagnosis not present

## 2017-04-24 DIAGNOSIS — S46002A Unspecified injury of muscle(s) and tendon(s) of the rotator cuff of left shoulder, initial encounter: Secondary | ICD-10-CM | POA: Diagnosis not present

## 2017-04-24 DIAGNOSIS — M19012 Primary osteoarthritis, left shoulder: Secondary | ICD-10-CM | POA: Diagnosis not present

## 2017-04-24 DIAGNOSIS — M25512 Pain in left shoulder: Secondary | ICD-10-CM | POA: Diagnosis not present

## 2017-04-24 DIAGNOSIS — I2510 Atherosclerotic heart disease of native coronary artery without angina pectoris: Secondary | ICD-10-CM | POA: Diagnosis not present

## 2017-04-24 DIAGNOSIS — I252 Old myocardial infarction: Secondary | ICD-10-CM | POA: Diagnosis not present

## 2017-05-09 DIAGNOSIS — M7542 Impingement syndrome of left shoulder: Secondary | ICD-10-CM | POA: Diagnosis not present

## 2017-05-09 DIAGNOSIS — M542 Cervicalgia: Secondary | ICD-10-CM | POA: Diagnosis not present

## 2017-05-09 DIAGNOSIS — M5010 Cervical disc disorder with radiculopathy, unspecified cervical region: Secondary | ICD-10-CM | POA: Diagnosis not present

## 2017-05-09 DIAGNOSIS — M25519 Pain in unspecified shoulder: Secondary | ICD-10-CM | POA: Diagnosis not present

## 2017-05-17 DIAGNOSIS — M50121 Cervical disc disorder at C4-C5 level with radiculopathy: Secondary | ICD-10-CM | POA: Diagnosis not present

## 2017-05-17 DIAGNOSIS — M542 Cervicalgia: Secondary | ICD-10-CM | POA: Diagnosis not present

## 2017-05-17 DIAGNOSIS — M5011 Cervical disc disorder with radiculopathy,  high cervical region: Secondary | ICD-10-CM | POA: Diagnosis not present

## 2017-05-17 DIAGNOSIS — M50123 Cervical disc disorder at C6-C7 level with radiculopathy: Secondary | ICD-10-CM | POA: Diagnosis not present

## 2017-05-17 DIAGNOSIS — M50122 Cervical disc disorder at C5-C6 level with radiculopathy: Secondary | ICD-10-CM | POA: Diagnosis not present

## 2017-07-10 DIAGNOSIS — M50823 Other cervical disc disorders at C6-C7 level: Secondary | ICD-10-CM | POA: Diagnosis not present

## 2017-07-10 DIAGNOSIS — M542 Cervicalgia: Secondary | ICD-10-CM | POA: Diagnosis not present

## 2017-07-10 DIAGNOSIS — M50822 Other cervical disc disorders at C5-C6 level: Secondary | ICD-10-CM | POA: Diagnosis not present

## 2017-07-21 ENCOUNTER — Other Ambulatory Visit: Payer: Self-pay | Admitting: Orthopedic Surgery

## 2017-07-21 DIAGNOSIS — M542 Cervicalgia: Secondary | ICD-10-CM

## 2017-07-31 ENCOUNTER — Ambulatory Visit
Admission: RE | Admit: 2017-07-31 | Discharge: 2017-07-31 | Disposition: A | Discharge status: Home / Self Care | Payer: Self-pay | Source: Ambulatory Visit | Attending: Orthopedic Surgery | Admitting: Orthopedic Surgery

## 2017-07-31 DIAGNOSIS — M542 Cervicalgia: Secondary | ICD-10-CM

## 2017-07-31 MED ORDER — IOPAMIDOL (ISOVUE-M 300) INJECTION 61%
1.0000 mL | Freq: Once | INTRAMUSCULAR | Status: AC | PRN
  Administered 2017-07-31: 1 mL via EPIDURAL

## 2017-07-31 MED ORDER — TRIAMCINOLONE ACETONIDE 40 MG/ML IJ SUSP (RADIOLOGY)
60.0000 mg | Freq: Once | INTRAMUSCULAR | Status: AC
  Administered 2017-07-31: 60 mg via EPIDURAL

## 2017-07-31 NOTE — Discharge Instructions (Signed)

## 2017-08-08 DIAGNOSIS — M50822 Other cervical disc disorders at C5-C6 level: Secondary | ICD-10-CM | POA: Diagnosis not present

## 2017-08-08 DIAGNOSIS — M50823 Other cervical disc disorders at C6-C7 level: Secondary | ICD-10-CM | POA: Diagnosis not present

## 2017-08-16 DIAGNOSIS — M6281 Muscle weakness (generalized): Secondary | ICD-10-CM | POA: Diagnosis not present

## 2017-08-16 DIAGNOSIS — M542 Cervicalgia: Secondary | ICD-10-CM | POA: Diagnosis not present

## 2017-08-18 DIAGNOSIS — M542 Cervicalgia: Secondary | ICD-10-CM | POA: Diagnosis not present

## 2017-08-18 DIAGNOSIS — M6281 Muscle weakness (generalized): Secondary | ICD-10-CM | POA: Diagnosis not present

## 2017-08-22 DIAGNOSIS — M542 Cervicalgia: Secondary | ICD-10-CM | POA: Diagnosis not present

## 2017-08-22 DIAGNOSIS — I10 Essential (primary) hypertension: Secondary | ICD-10-CM | POA: Diagnosis not present

## 2017-08-22 DIAGNOSIS — M5412 Radiculopathy, cervical region: Secondary | ICD-10-CM | POA: Diagnosis not present

## 2017-09-21 DIAGNOSIS — I10 Essential (primary) hypertension: Secondary | ICD-10-CM | POA: Diagnosis not present

## 2017-09-21 DIAGNOSIS — M5013 Cervical disc disorder with radiculopathy, cervicothoracic region: Secondary | ICD-10-CM | POA: Diagnosis not present

## 2017-09-21 DIAGNOSIS — Z880 Allergy status to penicillin: Secondary | ICD-10-CM | POA: Diagnosis not present

## 2017-09-21 DIAGNOSIS — Z79899 Other long term (current) drug therapy: Secondary | ICD-10-CM | POA: Diagnosis not present

## 2017-09-21 DIAGNOSIS — M5412 Radiculopathy, cervical region: Secondary | ICD-10-CM | POA: Diagnosis not present

## 2017-09-21 DIAGNOSIS — M4802 Spinal stenosis, cervical region: Secondary | ICD-10-CM | POA: Diagnosis not present

## 2017-09-21 DIAGNOSIS — M5033 Other cervical disc degeneration, cervicothoracic region: Secondary | ICD-10-CM | POA: Diagnosis not present

## 2017-09-21 DIAGNOSIS — Z885 Allergy status to narcotic agent status: Secondary | ICD-10-CM | POA: Diagnosis not present

## 2017-09-21 DIAGNOSIS — I252 Old myocardial infarction: Secondary | ICD-10-CM | POA: Diagnosis not present

## 2017-09-21 DIAGNOSIS — Z7984 Long term (current) use of oral hypoglycemic drugs: Secondary | ICD-10-CM | POA: Diagnosis not present

## 2017-09-21 DIAGNOSIS — E119 Type 2 diabetes mellitus without complications: Secondary | ICD-10-CM | POA: Diagnosis not present

## 2017-10-02 DIAGNOSIS — Z8639 Personal history of other endocrine, nutritional and metabolic disease: Secondary | ICD-10-CM | POA: Diagnosis not present

## 2017-10-02 DIAGNOSIS — I2510 Atherosclerotic heart disease of native coronary artery without angina pectoris: Secondary | ICD-10-CM | POA: Diagnosis not present

## 2017-10-02 DIAGNOSIS — R110 Nausea: Secondary | ICD-10-CM | POA: Diagnosis not present

## 2017-10-02 DIAGNOSIS — R0789 Other chest pain: Secondary | ICD-10-CM | POA: Diagnosis not present

## 2017-10-02 DIAGNOSIS — Z87891 Personal history of nicotine dependence: Secondary | ICD-10-CM | POA: Diagnosis not present

## 2017-10-02 DIAGNOSIS — R079 Chest pain, unspecified: Secondary | ICD-10-CM | POA: Diagnosis not present

## 2017-10-02 DIAGNOSIS — I4510 Unspecified right bundle-branch block: Secondary | ICD-10-CM | POA: Diagnosis not present

## 2017-10-02 DIAGNOSIS — R1013 Epigastric pain: Secondary | ICD-10-CM | POA: Diagnosis not present

## 2017-10-02 DIAGNOSIS — N200 Calculus of kidney: Secondary | ICD-10-CM | POA: Diagnosis not present

## 2017-10-02 DIAGNOSIS — I252 Old myocardial infarction: Secondary | ICD-10-CM | POA: Diagnosis not present

## 2017-10-02 DIAGNOSIS — Z9049 Acquired absence of other specified parts of digestive tract: Secondary | ICD-10-CM | POA: Diagnosis not present

## 2017-10-02 DIAGNOSIS — K5730 Diverticulosis of large intestine without perforation or abscess without bleeding: Secondary | ICD-10-CM | POA: Diagnosis not present

## 2017-10-17 DIAGNOSIS — I10 Essential (primary) hypertension: Secondary | ICD-10-CM | POA: Diagnosis not present

## 2017-10-17 DIAGNOSIS — M5030 Other cervical disc degeneration, unspecified cervical region: Secondary | ICD-10-CM | POA: Diagnosis not present

## 2017-10-17 DIAGNOSIS — M5412 Radiculopathy, cervical region: Secondary | ICD-10-CM | POA: Diagnosis not present

## 2017-10-17 DIAGNOSIS — M7552 Bursitis of left shoulder: Secondary | ICD-10-CM | POA: Diagnosis not present

## 2017-10-17 DIAGNOSIS — M9981 Other biomechanical lesions of cervical region: Secondary | ICD-10-CM | POA: Diagnosis not present

## 2017-10-19 DIAGNOSIS — K219 Gastro-esophageal reflux disease without esophagitis: Secondary | ICD-10-CM | POA: Diagnosis not present

## 2017-11-14 DIAGNOSIS — M25512 Pain in left shoulder: Secondary | ICD-10-CM | POA: Diagnosis not present

## 2017-11-14 DIAGNOSIS — G8929 Other chronic pain: Secondary | ICD-10-CM | POA: Diagnosis not present

## 2017-11-14 DIAGNOSIS — I10 Essential (primary) hypertension: Secondary | ICD-10-CM | POA: Diagnosis not present

## 2017-11-14 DIAGNOSIS — M5412 Radiculopathy, cervical region: Secondary | ICD-10-CM | POA: Diagnosis not present

## 2017-11-21 DIAGNOSIS — Z881 Allergy status to other antibiotic agents status: Secondary | ICD-10-CM | POA: Diagnosis not present

## 2017-11-21 DIAGNOSIS — M19012 Primary osteoarthritis, left shoulder: Secondary | ICD-10-CM | POA: Diagnosis not present

## 2017-11-21 DIAGNOSIS — M25412 Effusion, left shoulder: Secondary | ICD-10-CM | POA: Diagnosis not present

## 2017-11-21 DIAGNOSIS — M25512 Pain in left shoulder: Secondary | ICD-10-CM | POA: Diagnosis not present

## 2017-11-21 DIAGNOSIS — Z886 Allergy status to analgesic agent status: Secondary | ICD-10-CM | POA: Diagnosis not present

## 2017-11-21 DIAGNOSIS — G8929 Other chronic pain: Secondary | ICD-10-CM | POA: Diagnosis not present

## 2017-11-21 DIAGNOSIS — M9981 Other biomechanical lesions of cervical region: Secondary | ICD-10-CM | POA: Diagnosis not present

## 2017-11-21 DIAGNOSIS — M5412 Radiculopathy, cervical region: Secondary | ICD-10-CM | POA: Diagnosis not present

## 2017-11-21 DIAGNOSIS — I10 Essential (primary) hypertension: Secondary | ICD-10-CM | POA: Diagnosis not present

## 2017-11-21 DIAGNOSIS — M7582 Other shoulder lesions, left shoulder: Secondary | ICD-10-CM | POA: Diagnosis not present

## 2017-11-21 DIAGNOSIS — M75122 Complete rotator cuff tear or rupture of left shoulder, not specified as traumatic: Secondary | ICD-10-CM | POA: Diagnosis not present

## 2018-01-02 DIAGNOSIS — I2510 Atherosclerotic heart disease of native coronary artery without angina pectoris: Secondary | ICD-10-CM | POA: Diagnosis not present

## 2018-01-02 DIAGNOSIS — Z01810 Encounter for preprocedural cardiovascular examination: Secondary | ICD-10-CM | POA: Diagnosis not present

## 2018-01-02 DIAGNOSIS — Z01812 Encounter for preprocedural laboratory examination: Secondary | ICD-10-CM | POA: Diagnosis not present

## 2018-01-02 DIAGNOSIS — D72829 Elevated white blood cell count, unspecified: Secondary | ICD-10-CM | POA: Diagnosis not present

## 2018-01-02 DIAGNOSIS — E785 Hyperlipidemia, unspecified: Secondary | ICD-10-CM | POA: Diagnosis not present

## 2018-01-02 DIAGNOSIS — F329 Major depressive disorder, single episode, unspecified: Secondary | ICD-10-CM | POA: Diagnosis not present

## 2018-01-02 DIAGNOSIS — M5030 Other cervical disc degeneration, unspecified cervical region: Secondary | ICD-10-CM | POA: Diagnosis not present

## 2018-01-02 DIAGNOSIS — Z6834 Body mass index (BMI) 34.0-34.9, adult: Secondary | ICD-10-CM | POA: Diagnosis not present

## 2018-01-02 DIAGNOSIS — I252 Old myocardial infarction: Secondary | ICD-10-CM | POA: Diagnosis not present

## 2018-01-02 DIAGNOSIS — Z951 Presence of aortocoronary bypass graft: Secondary | ICD-10-CM | POA: Diagnosis not present

## 2018-01-02 DIAGNOSIS — Z22321 Carrier or suspected carrier of Methicillin susceptible Staphylococcus aureus: Secondary | ICD-10-CM | POA: Diagnosis not present

## 2018-01-02 DIAGNOSIS — E6609 Other obesity due to excess calories: Secondary | ICD-10-CM | POA: Diagnosis not present

## 2018-01-02 DIAGNOSIS — I10 Essential (primary) hypertension: Secondary | ICD-10-CM | POA: Diagnosis not present

## 2018-01-02 DIAGNOSIS — E1165 Type 2 diabetes mellitus with hyperglycemia: Secondary | ICD-10-CM | POA: Diagnosis not present

## 2018-01-09 DIAGNOSIS — Z6832 Body mass index (BMI) 32.0-32.9, adult: Secondary | ICD-10-CM | POA: Diagnosis not present

## 2018-01-09 DIAGNOSIS — Z0000 Encounter for general adult medical examination without abnormal findings: Secondary | ICD-10-CM | POA: Diagnosis not present

## 2018-01-09 DIAGNOSIS — I2510 Atherosclerotic heart disease of native coronary artery without angina pectoris: Secondary | ICD-10-CM | POA: Diagnosis not present

## 2018-01-09 DIAGNOSIS — K219 Gastro-esophageal reflux disease without esophagitis: Secondary | ICD-10-CM | POA: Diagnosis not present

## 2018-01-09 DIAGNOSIS — E7800 Pure hypercholesterolemia, unspecified: Secondary | ICD-10-CM | POA: Diagnosis not present

## 2018-01-09 DIAGNOSIS — Z125 Encounter for screening for malignant neoplasm of prostate: Secondary | ICD-10-CM | POA: Diagnosis not present

## 2018-01-09 DIAGNOSIS — I10 Essential (primary) hypertension: Secondary | ICD-10-CM | POA: Diagnosis not present

## 2018-01-09 DIAGNOSIS — E119 Type 2 diabetes mellitus without complications: Secondary | ICD-10-CM | POA: Diagnosis not present

## 2018-01-09 DIAGNOSIS — E669 Obesity, unspecified: Secondary | ICD-10-CM | POA: Diagnosis not present

## 2018-01-09 DIAGNOSIS — F411 Generalized anxiety disorder: Secondary | ICD-10-CM | POA: Diagnosis not present

## 2018-01-09 DIAGNOSIS — Z79899 Other long term (current) drug therapy: Secondary | ICD-10-CM | POA: Diagnosis not present

## 2018-01-09 DIAGNOSIS — Z1211 Encounter for screening for malignant neoplasm of colon: Secondary | ICD-10-CM | POA: Diagnosis not present

## 2018-01-10 DIAGNOSIS — M542 Cervicalgia: Secondary | ICD-10-CM | POA: Diagnosis not present

## 2018-01-10 DIAGNOSIS — M5412 Radiculopathy, cervical region: Secondary | ICD-10-CM | POA: Diagnosis not present

## 2018-01-10 DIAGNOSIS — Z66 Do not resuscitate: Secondary | ICD-10-CM | POA: Diagnosis present

## 2018-01-10 DIAGNOSIS — I252 Old myocardial infarction: Secondary | ICD-10-CM | POA: Diagnosis not present

## 2018-01-10 DIAGNOSIS — Z885 Allergy status to narcotic agent status: Secondary | ICD-10-CM | POA: Diagnosis not present

## 2018-01-10 DIAGNOSIS — F329 Major depressive disorder, single episode, unspecified: Secondary | ICD-10-CM | POA: Diagnosis present

## 2018-01-10 DIAGNOSIS — M50123 Cervical disc disorder at C6-C7 level with radiculopathy: Secondary | ICD-10-CM | POA: Diagnosis present

## 2018-01-10 DIAGNOSIS — Z833 Family history of diabetes mellitus: Secondary | ICD-10-CM | POA: Diagnosis not present

## 2018-01-10 DIAGNOSIS — A4901 Methicillin susceptible Staphylococcus aureus infection, unspecified site: Secondary | ICD-10-CM | POA: Diagnosis not present

## 2018-01-10 DIAGNOSIS — Z87891 Personal history of nicotine dependence: Secondary | ICD-10-CM | POA: Diagnosis not present

## 2018-01-10 DIAGNOSIS — Z7984 Long term (current) use of oral hypoglycemic drugs: Secondary | ICD-10-CM | POA: Diagnosis not present

## 2018-01-10 DIAGNOSIS — Z96641 Presence of right artificial hip joint: Secondary | ICD-10-CM | POA: Diagnosis present

## 2018-01-10 DIAGNOSIS — E669 Obesity, unspecified: Secondary | ICD-10-CM | POA: Diagnosis present

## 2018-01-10 DIAGNOSIS — E785 Hyperlipidemia, unspecified: Secondary | ICD-10-CM | POA: Diagnosis present

## 2018-01-10 DIAGNOSIS — Z8249 Family history of ischemic heart disease and other diseases of the circulatory system: Secondary | ICD-10-CM | POA: Diagnosis not present

## 2018-01-10 DIAGNOSIS — Z79891 Long term (current) use of opiate analgesic: Secondary | ICD-10-CM | POA: Diagnosis not present

## 2018-01-10 DIAGNOSIS — E119 Type 2 diabetes mellitus without complications: Secondary | ICD-10-CM | POA: Diagnosis present

## 2018-01-10 DIAGNOSIS — M4802 Spinal stenosis, cervical region: Secondary | ICD-10-CM | POA: Diagnosis present

## 2018-01-10 DIAGNOSIS — Z880 Allergy status to penicillin: Secondary | ICD-10-CM | POA: Diagnosis not present

## 2018-01-10 DIAGNOSIS — I10 Essential (primary) hypertension: Secondary | ICD-10-CM | POA: Diagnosis present

## 2018-01-10 DIAGNOSIS — Z7982 Long term (current) use of aspirin: Secondary | ICD-10-CM | POA: Diagnosis not present

## 2018-01-10 DIAGNOSIS — Z6833 Body mass index (BMI) 33.0-33.9, adult: Secondary | ICD-10-CM | POA: Diagnosis not present

## 2018-01-10 DIAGNOSIS — M25812 Other specified joint disorders, left shoulder: Secondary | ICD-10-CM | POA: Diagnosis present

## 2018-01-10 DIAGNOSIS — Z951 Presence of aortocoronary bypass graft: Secondary | ICD-10-CM | POA: Diagnosis not present

## 2018-01-10 DIAGNOSIS — I2510 Atherosclerotic heart disease of native coronary artery without angina pectoris: Secondary | ICD-10-CM | POA: Diagnosis present

## 2018-01-10 DIAGNOSIS — M4722 Other spondylosis with radiculopathy, cervical region: Secondary | ICD-10-CM | POA: Diagnosis not present

## 2018-01-10 DIAGNOSIS — M1990 Unspecified osteoarthritis, unspecified site: Secondary | ICD-10-CM | POA: Diagnosis present

## 2018-01-14 DIAGNOSIS — I252 Old myocardial infarction: Secondary | ICD-10-CM | POA: Diagnosis not present

## 2018-01-14 DIAGNOSIS — Z96652 Presence of left artificial knee joint: Secondary | ICD-10-CM | POA: Diagnosis present

## 2018-01-14 DIAGNOSIS — Z7984 Long term (current) use of oral hypoglycemic drugs: Secondary | ICD-10-CM | POA: Diagnosis not present

## 2018-01-14 DIAGNOSIS — I959 Hypotension, unspecified: Secondary | ICD-10-CM | POA: Diagnosis present

## 2018-01-14 DIAGNOSIS — Z79899 Other long term (current) drug therapy: Secondary | ICD-10-CM | POA: Diagnosis not present

## 2018-01-14 DIAGNOSIS — K7200 Acute and subacute hepatic failure without coma: Secondary | ICD-10-CM | POA: Diagnosis present

## 2018-01-14 DIAGNOSIS — R079 Chest pain, unspecified: Secondary | ICD-10-CM | POA: Diagnosis not present

## 2018-01-14 DIAGNOSIS — G931 Anoxic brain damage, not elsewhere classified: Secondary | ICD-10-CM | POA: Diagnosis present

## 2018-01-14 DIAGNOSIS — R7989 Other specified abnormal findings of blood chemistry: Secondary | ICD-10-CM | POA: Diagnosis present

## 2018-01-14 DIAGNOSIS — I2510 Atherosclerotic heart disease of native coronary artery without angina pectoris: Secondary | ICD-10-CM | POA: Diagnosis present

## 2018-01-14 DIAGNOSIS — I517 Cardiomegaly: Secondary | ICD-10-CM | POA: Diagnosis not present

## 2018-01-14 DIAGNOSIS — Z66 Do not resuscitate: Secondary | ICD-10-CM | POA: Diagnosis present

## 2018-01-14 DIAGNOSIS — M5412 Radiculopathy, cervical region: Secondary | ICD-10-CM | POA: Diagnosis present

## 2018-01-14 DIAGNOSIS — I4510 Unspecified right bundle-branch block: Secondary | ICD-10-CM | POA: Diagnosis present

## 2018-01-14 DIAGNOSIS — Z7982 Long term (current) use of aspirin: Secondary | ICD-10-CM | POA: Diagnosis not present

## 2018-01-14 DIAGNOSIS — Z881 Allergy status to other antibiotic agents status: Secondary | ICD-10-CM | POA: Diagnosis not present

## 2018-01-14 DIAGNOSIS — E119 Type 2 diabetes mellitus without complications: Secondary | ICD-10-CM | POA: Diagnosis present

## 2018-01-14 DIAGNOSIS — Z951 Presence of aortocoronary bypass graft: Secondary | ICD-10-CM | POA: Diagnosis not present

## 2018-01-14 DIAGNOSIS — Z8674 Personal history of sudden cardiac arrest: Secondary | ICD-10-CM | POA: Diagnosis not present

## 2018-01-14 DIAGNOSIS — I10 Essential (primary) hypertension: Secondary | ICD-10-CM | POA: Diagnosis present

## 2018-01-14 DIAGNOSIS — I469 Cardiac arrest, cause unspecified: Secondary | ICD-10-CM | POA: Diagnosis not present

## 2018-01-14 DIAGNOSIS — R000 Tachycardia, unspecified: Secondary | ICD-10-CM | POA: Diagnosis not present

## 2018-01-14 DIAGNOSIS — Z87891 Personal history of nicotine dependence: Secondary | ICD-10-CM | POA: Diagnosis not present

## 2018-01-14 DIAGNOSIS — R570 Cardiogenic shock: Secondary | ICD-10-CM | POA: Diagnosis present

## 2018-01-14 DIAGNOSIS — M1611 Unilateral primary osteoarthritis, right hip: Secondary | ICD-10-CM | POA: Diagnosis present

## 2018-01-14 DIAGNOSIS — I4891 Unspecified atrial fibrillation: Secondary | ICD-10-CM | POA: Diagnosis not present

## 2018-01-14 DIAGNOSIS — Z885 Allergy status to narcotic agent status: Secondary | ICD-10-CM | POA: Diagnosis not present

## 2018-01-14 DIAGNOSIS — K7581 Nonalcoholic steatohepatitis (NASH): Secondary | ICD-10-CM | POA: Diagnosis present

## 2018-01-26 DEATH — deceased

## 2018-05-26 IMAGING — XA DG INJECT/[PERSON_NAME] INC NEEDLE/CATH/PLC EPI/CERV/THOR W/IMG
2 series · 2 of 2 positions shown · non-contrast
Comparison: none

CLINICAL DATA: Cervical pain. Displacement of the cervical disc at
C4-5, C5-6, and C6-7. Multilevel foraminal stenosis bilaterally.
Left upper extremity radiculopathy.

[Series 1: ortho standard · 1 of 1 slices shown (1 of 2)]
[im 1/1]
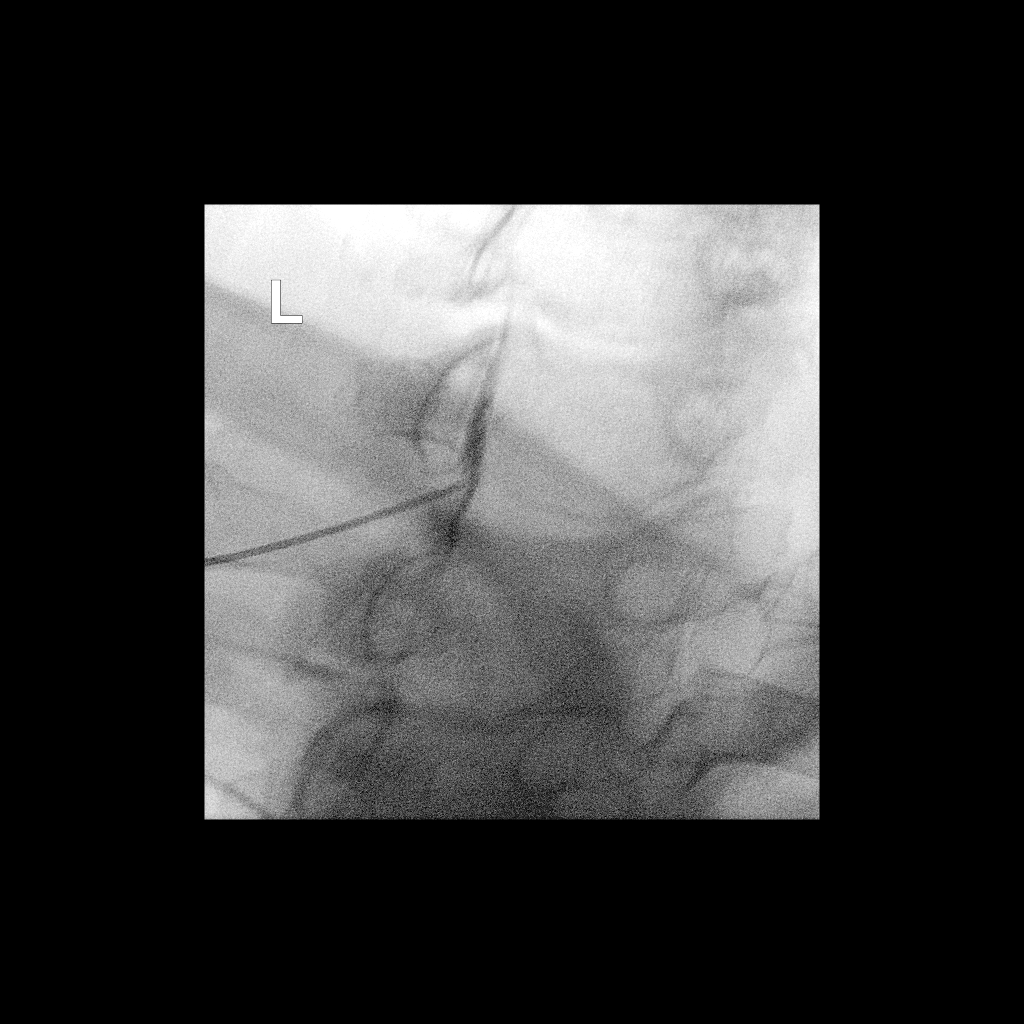

[Series 2: ortho standard · 1 of 1 slices shown (2 of 2)]
[im 1/1]
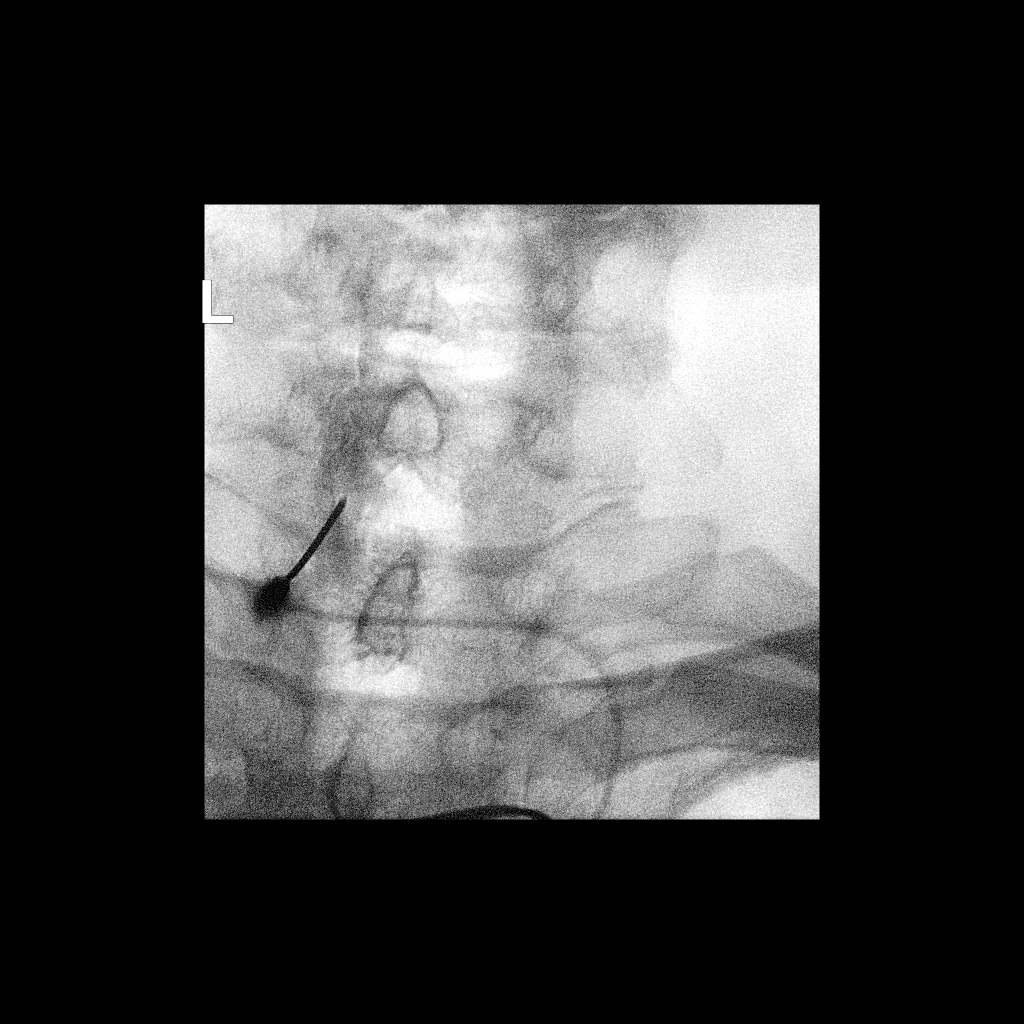

[2 of 2 positions shown; findings below may reference images not displayed]

FLUOROSCOPY TIME:  Radiation Exposure Index (as provided by the
fluoroscopic device): 13.45 uGy*m2

If the device does not provide the exposure index:

Fluoroscopy Time:  22 seconds

Number of Acquired Images:  0

PROCEDURE:
CERVICAL EPIDURAL INJECTION

An interlaminar approach was performed on the left at C7-T1 . A 20
gauge epidural needle was advanced using loss-of-resistance
technique.

DIAGNOSTIC EPIDURAL INJECTION

Injection of Isovue-M 300 shows a good epidural pattern with spread
above and below the level of needle placement, primarily on the
left. No vascular opacification is seen. THERAPEUTIC

EPIDURAL INJECTION

1.5 ml of Kenalog 40 mixed with 1 ml of 1% Lidocaine and 2 ml of
normal saline were then instilled. The procedure was well-tolerated,
and the patient was discharged thirty minutes following the
injection in good condition.
IMPRESSION: Technically successful burst epidural injection on the left at
C7-T1.
